# Patient Record
Sex: Female | Born: 1980 | Race: White | Hispanic: No | Smoking: Never smoker
Health system: Southern US, Community
[De-identification: ages and names within clinical notes are randomized; demographics above are authoritative.]

## PROBLEM LIST (undated history)

## (undated) DIAGNOSIS — F419 Anxiety disorder, unspecified: Secondary | ICD-10-CM

## (undated) DIAGNOSIS — F909 Attention-deficit hyperactivity disorder, unspecified type: Secondary | ICD-10-CM

## (undated) HISTORY — PX: NO PAST SURGERIES: SHX2092

## (undated) HISTORY — DX: Anxiety disorder, unspecified: F41.9

## (undated) HISTORY — DX: Attention-deficit hyperactivity disorder, unspecified type: F90.9

---

## 2000-11-19 ENCOUNTER — Inpatient Hospital Stay (HOSPITAL_COMMUNITY): Admission: AD | Admit: 2000-11-19 | Discharge: 2000-11-19 | Payer: Self-pay | Admitting: Obstetrics and Gynecology

## 2000-11-21 ENCOUNTER — Inpatient Hospital Stay (HOSPITAL_COMMUNITY): Admission: AD | Admit: 2000-11-21 | Discharge: 2000-11-23 | Payer: Self-pay | Admitting: Obstetrics & Gynecology

## 2000-12-19 ENCOUNTER — Other Ambulatory Visit: Admission: RE | Admit: 2000-12-19 | Discharge: 2000-12-19 | Payer: Self-pay | Admitting: Obstetrics & Gynecology

## 2002-06-01 ENCOUNTER — Inpatient Hospital Stay (HOSPITAL_COMMUNITY): Admission: AD | Admit: 2002-06-01 | Discharge: 2002-06-01 | Payer: Self-pay | Admitting: Obstetrics and Gynecology

## 2002-06-01 ENCOUNTER — Encounter: Payer: Self-pay | Admitting: Obstetrics and Gynecology

## 2002-06-25 ENCOUNTER — Inpatient Hospital Stay (HOSPITAL_COMMUNITY): Admission: AD | Admit: 2002-06-25 | Discharge: 2002-06-25 | Payer: Self-pay | Admitting: Obstetrics and Gynecology

## 2002-06-25 ENCOUNTER — Inpatient Hospital Stay (HOSPITAL_COMMUNITY): Admission: AD | Admit: 2002-06-25 | Discharge: 2002-06-27 | Payer: Self-pay | Admitting: Obstetrics and Gynecology

## 2002-08-09 ENCOUNTER — Other Ambulatory Visit: Admission: RE | Admit: 2002-08-09 | Discharge: 2002-08-09 | Payer: Self-pay | Admitting: Obstetrics and Gynecology

## 2003-12-02 ENCOUNTER — Other Ambulatory Visit: Admission: RE | Admit: 2003-12-02 | Discharge: 2003-12-02 | Payer: Self-pay | Admitting: Obstetrics and Gynecology

## 2006-07-23 ENCOUNTER — Inpatient Hospital Stay (HOSPITAL_COMMUNITY): Admission: AD | Admit: 2006-07-23 | Discharge: 2006-07-25 | Payer: Self-pay | Admitting: Obstetrics and Gynecology

## 2009-01-16 ENCOUNTER — Ambulatory Visit: Payer: Self-pay | Admitting: Internal Medicine

## 2009-01-16 DIAGNOSIS — I4949 Other premature depolarization: Secondary | ICD-10-CM

## 2009-01-16 DIAGNOSIS — Q898 Other specified congenital malformations: Secondary | ICD-10-CM

## 2009-01-16 DIAGNOSIS — R55 Syncope and collapse: Secondary | ICD-10-CM

## 2016-04-08 ENCOUNTER — Other Ambulatory Visit (HOSPITAL_COMMUNITY): Payer: Self-pay | Admitting: Advanced Practice Midwife

## 2016-07-06 ENCOUNTER — Encounter: Payer: Self-pay | Admitting: Emergency Medicine

## 2016-07-06 ENCOUNTER — Emergency Department
Admission: EM | Admit: 2016-07-06 | Discharge: 2016-07-06 | Disposition: A | Discharge status: Home / Self Care | Payer: BC Managed Care – PPO | Attending: Emergency Medicine | Admitting: Emergency Medicine

## 2016-07-06 ENCOUNTER — Emergency Department: Payer: BC Managed Care – PPO

## 2016-07-06 DIAGNOSIS — M79604 Pain in right leg: Secondary | ICD-10-CM | POA: Insufficient documentation

## 2016-07-06 LAB — CBC WITH DIFFERENTIAL/PLATELET
Basophils Absolute: 0 10*3/uL (ref 0–0.1)
Basophils Relative: 1 %
Eosinophils Absolute: 0.1 10*3/uL (ref 0–0.7)
Eosinophils Relative: 2 %
HEMATOCRIT: 40.8 % (ref 35.0–47.0)
Hemoglobin: 14 g/dL (ref 12.0–16.0)
Lymphocytes Relative: 24 %
Lymphs Abs: 1.6 10*3/uL (ref 1.0–3.6)
MCH: 31.4 pg (ref 26.0–34.0)
MCHC: 34.4 g/dL (ref 32.0–36.0)
MCV: 91.3 fL (ref 80.0–100.0)
MONOS PCT: 8 %
Monocytes Absolute: 0.5 10*3/uL (ref 0.2–0.9)
Neutro Abs: 4.3 10*3/uL (ref 1.4–6.5)
Neutrophils Relative %: 65 %
Platelets: 184 10*3/uL (ref 150–440)
RBC: 4.47 MIL/uL (ref 3.80–5.20)
RDW: 12.8 % (ref 11.5–14.5)
WBC: 6.6 10*3/uL (ref 3.6–11.0)

## 2016-07-06 LAB — BASIC METABOLIC PANEL
Anion gap: 5 (ref 5–15)
BUN: 13 mg/dL (ref 6–20)
CO2: 29 mmol/L (ref 22–32)
Calcium: 8.8 mg/dL — ABNORMAL LOW (ref 8.9–10.3)
Chloride: 105 mmol/L (ref 101–111)
Creatinine, Ser: 0.67 mg/dL (ref 0.44–1.00)
GFR calc Af Amer: >60 mL/min (ref >60)
GFR calc non Af Amer: >60 mL/min (ref >60)
Glucose, Bld: 88 mg/dL (ref 65–99)
Potassium: 4.1 mmol/L (ref 3.5–5.1)
Sodium: 139 mmol/L (ref 135–145)

## 2016-07-06 LAB — CK: Total CK: 41 U/L (ref 38–234)

## 2016-07-06 NOTE — ED Provider Notes (Signed)
[facility] Emergency Department Provider Note  ____________________________________________   First MD Initiated Contact with Patient 07/06/16 1809     (approximate)  I have reviewed the triage vital signs and the nursing notes.   HISTORY  Chief Complaint Leg Pain   HPI Brenda Snyder is a 36 y.o. female with a history of PVCs and long QT syndrome who is presenting with right lateral 5 pain over the past month. She says over the past week the pain is increased after 4 hour car trip. She has a Mirena IUD but does not smoke or take any exogenous estrogens. She has no history of DVT or PE and her family. She has not noted any swelling to the lower extremities, bilaterally. She says the pain is a constant ache that does not worsen when she extends her leg. She does not note any known injury or exertion. She does not exercise, run or ride a bike regularly.Dry cough noted on triage note. However, there were no colectomy about this.   History reviewed. No pertinent past medical history.  Patient Active Problem List   Diagnosis Date Noted  . PREMATURE VENTRICULAR CONTRACTIONS 01/16/2009  . LONG QT SYNDROME 01/16/2009  . SYNCOPE, VASOVAGAL 01/16/2009    History reviewed. No pertinent surgical history.  Prior to Admission medications   Not on File    Allergies Clarithromycin  History reviewed. No pertinent family history.  Social History Social History  Substance Use Topics  . Smoking status: Never Smoker  . Smokeless tobacco: Never Used  . Alcohol use Yes    Review of Systems Constitutional: No fever/chills Eyes: No visual changes. ENT: No sore throat. Cardiovascular: Denies chest pain. Respiratory: Denies shortness of breath. Gastrointestinal: No abdominal pain.  No nausea, no vomiting.  No diarrhea.  No constipation. Genitourinary: Negative for dysuria. Musculoskeletal: Negative for back pain. Skin: Negative for rash. Neurological:  Negative for headaches, focal weakness or numbness.  10-point ROS otherwise negative.  ____________________________________________   PHYSICAL EXAM:  VITAL SIGNS: ED Triage Vitals  Enc Vitals Group     BP 07/06/16 1753 118/72     Pulse Rate 07/06/16 1753 90     Resp 07/06/16 1753 18     Temp 07/06/16 1753 99.2 F (37.3 C)     Temp Source 07/06/16 1753 Oral     SpO2 07/06/16 1753 100 %     Weight 07/06/16 1754 152 lb (68.9 kg)     Height 07/06/16 1754  (1.676 m)     Head Circumference --      Peak Flow --      Pain Score 07/06/16 1753 4     Pain Loc --      Pain Edu? --      Excl. in GC? --     Constitutional: Alert and oriented. Well appearing and in no acute distress. Eyes: Conjunctivae are normal. EOMI. Head: Atraumatic. Nose: No congestion/rhinnorhea. Mouth/Throat: Mucous membranes are moist.  Neck: No stridor.   Cardiovascular: Normal rate, regular rhythm. Grossly normal heart sounds.  Good peripheral circulation. Respiratory: Normal respiratory effort.  No retractions. Lungs CTAB. Gastrointestinal: Soft and nontender. No distention.  Musculoskeletal: No lower extremity edema.  No joint effusions. Mild tenderness to palpation over the proximal third of the lateral hamstring tendons. No masses palpated. 5 out of 5 strength to bilateral lower extremities. Sensory to light touch. No swelling noted, bilaterally. Dorsalis pedis as well as posterior tibial pulses are equal and intact bilaterally. Pain  is not out of proportion to the exam. Neurologic:  Normal speech and language. No gross focal neurologic deficits are appreciated.  Skin:  Skin is warm, dry and intact. No rash noted. Psychiatric: Mood and affect are normal. Speech and behavior are normal.  ____________________________________________   LABS (all labs ordered are listed, but only abnormal results are displayed)  Labs Reviewed  CK  CBC WITH DIFFERENTIAL/PLATELET  BASIC METABOLIC PANEL    ____________________________________________  EKG  ____________________________________________  RADIOLOGY  US Venous Img Lower Unilateral Right (Final result)  Result time 07/06/16 19:10:33  Final result by Hassan Buckler, MD (07/06/16 19:10:33)           Narrative:   CLINICAL DATA: Worsening right lower extremity pain for 1 month.  EXAM: RIGHT LOWER EXTREMITY VENOUS DOPPLER ULTRASOUND  TECHNIQUE: Gray-scale sonography with graded compression, as well as color Doppler and duplex ultrasound were performed to evaluate the lower extremity deep venous systems from the level of the common femoral vein and including the common femoral, femoral, profunda femoral, popliteal and calf veins including the posterior tibial, peroneal and gastrocnemius veins when visible. The superficial great saphenous vein was also interrogated. Spectral Doppler was utilized to evaluate flow at rest and with distal augmentation maneuvers in the common femoral, femoral and popliteal veins.  COMPARISON: None.  FINDINGS: Contralateral Common Femoral Vein: Respiratory phasicity is normal and symmetric with the symptomatic side. No evidence of thrombus. Normal compressibility.  Common Femoral Vein: No evidence of thrombus. Normal compressibility, respiratory phasicity and response to augmentation.  Saphenofemoral Junction: No evidence of thrombus. Normal compressibility and flow on color Doppler imaging.  Profunda Femoral Vein: No evidence of thrombus. Normal compressibility and flow on color Doppler imaging.  Femoral Vein: No evidence of thrombus. Normal compressibility, respiratory phasicity and response to augmentation.  Popliteal Vein: No evidence of thrombus. Normal compressibility, respiratory phasicity and response to augmentation.  Calf Veins: No evidence of thrombus. Normal compressibility and flow on color Doppler imaging.  Superficial Great Saphenous Vein: No evidence  of thrombus. Normal compressibility and flow on color Doppler imaging.  Venous Reflux: None.  Other Findings: None. No mass or fluid collection demonstrated at the area of pain as indicated by the patient in the posterior upper thigh.  IMPRESSION: No evidence of deep venous thrombosis in the right lower extremity.   Electronically Signed By: Delbert Phenix M.D. On: 07/06/2016 19:10            ____________________________________________   PROCEDURES  Procedure(s) performed:   Procedures  Critical Care performed:   ____________________________________________   INITIAL IMPRESSION / ASSESSMENT AND PLAN / ED COURSE  Pertinent labs & imaging results that were available during my care of the patient were reviewed by me and considered in my medical decision making (see chart for details).  ----------------------------------------- 8:26 PM on 07/06/2016 -----------------------------------------  Patient without any distress at this time. Resting comfortable. Very reassuring workup without evidence of DVT. Neurologic blood cell count. Pain is not out of portion to exam. Very unlikely to be severe soft tissue infection like necrotizing fasciitis. Recommended anti-inflammatory medications. Possible tendinitis. Will follow-up with her primary care doctor. Will be discharged home. Discussed lab results as well as imaging is plan with the family as well as the patient and they're understandable and comply.      ____________________________________________   FINAL CLINICAL IMPRESSION(S) / ED DIAGNOSES  Extremity pain.   NEW MEDICATIONS STARTED DURING THIS VISIT:  New Prescriptions   No medications on file  Note:  This document was prepared using Dragon voice recognition software and may include unintentional dictation errors.    Myrna Blazer, MD 07/06/16 2030

## 2016-07-06 NOTE — ED Triage Notes (Signed)
Pt reports right upper posterior leg pain x 1 month ,states the pain sometimes is better with walking, reports over the past week pain worse. Pt states on Wednesday the pain was the worst when she was traveling in the car for 4 hours round trip.  States pain to palpation. No meds for pain.  Pain worse at diferent times.   Tried using heating pad for pain relief. Describes pain as constant throbbing .  No decreased sensation at this time.  Pt also states she has had a dry cough x 4 weeks.

## 2016-09-16 ENCOUNTER — Other Ambulatory Visit: Payer: Self-pay | Admitting: Advanced Practice Midwife

## 2017-05-18 ENCOUNTER — Other Ambulatory Visit: Payer: Self-pay

## 2017-05-18 ENCOUNTER — Emergency Department
Admission: EM | Admit: 2017-05-18 | Discharge: 2017-05-18 | Disposition: A | Discharge status: Home / Self Care | Payer: BC Managed Care – PPO | Attending: Emergency Medicine | Admitting: Emergency Medicine

## 2017-05-18 ENCOUNTER — Encounter: Payer: Self-pay | Admitting: Emergency Medicine

## 2017-05-18 DIAGNOSIS — R51 Headache: Secondary | ICD-10-CM | POA: Insufficient documentation

## 2017-05-18 DIAGNOSIS — Z5321 Procedure and treatment not carried out due to patient leaving prior to being seen by health care provider: Secondary | ICD-10-CM | POA: Insufficient documentation

## 2017-05-18 NOTE — ED Triage Notes (Signed)
Pt reports that she thought that she had a sinus infection, but her head is hurting behind her right eye. She took some Ibuprofen and it helps for a little bit then comes back. She reports that she has had this for a week now.

## 2017-05-18 NOTE — ED Notes (Signed)
FN: [facility] reports of the worst headache of her  Life for two weeks.

## 2018-02-02 ENCOUNTER — Ambulatory Visit (INDEPENDENT_AMBULATORY_CARE_PROVIDER_SITE_OTHER): Payer: BC Managed Care – PPO | Admitting: Certified Nurse Midwife

## 2018-02-02 ENCOUNTER — Encounter: Payer: Self-pay | Admitting: Certified Nurse Midwife

## 2018-02-02 VITALS — BP 106/78 | HR 107 | Ht 66.0 in | Wt 136.6 lb

## 2018-02-02 DIAGNOSIS — Z30433 Encounter for removal and reinsertion of intrauterine contraceptive device: Secondary | ICD-10-CM | POA: Diagnosis not present

## 2018-02-02 NOTE — Patient Instructions (Signed)

## 2018-02-02 NOTE — Progress Notes (Signed)
  GYNECOLOGY OFFICE PROCEDURE NOTE  Brenda Snyder is a 37 y.o. No obstetric history on file. here for Mirena IUD removal and reinsertion. No GYN concerns.   IUD Removal and Reinsertion  Patient identified, informed consent performed, consent signed.   Discussed risks of irregular bleeding, cramping, infection, malpositioning or misplacement of the IUD outside the uterus which may require further procedures. Also advised to use backup contraception for one week as the risk of pregnancy is higher during the transition period of removing an IUD and replacing it with another one. Time out was performed. Speculum placed in the vagina. The strings of the IUD were not seen. IUD finder was used to retreive the strings from inside the cervix. Once the strings were visualized they were  grasped and pulled using ring forceps. The IUD was successfully removed in its entirety. The cervix was cleaned with Betadine x 2 and grasped anteriorly with a single tooth tenaculum.  The new Mirena IUD insertion apparatus was used to sound the uterus to 7 cm;  The uterus was anteverted. The Tenaculum was removed from anterior cervix (10 &2 o'clock)  to posterior cervix ( 5 & 8 o'clock) the IUD was then placed per manufacturer's recommendations. Strings trimmed to 3 cm. Tenaculum was removed, good hemostasis noted. Patient tolerated procedure well.   Patient was given post-procedure instructions.  She was reminded to have backup contraception for one week during this transition period between IUDs.  Patient was also asked to check IUD strings periodically and follow up in 4 weeks for IUD check.   Doreene Burke, CNM

## 2018-03-10 ENCOUNTER — Encounter: Payer: BC Managed Care – PPO | Admitting: Certified Nurse Midwife

## 2018-06-15 ENCOUNTER — Telehealth: Payer: Self-pay | Admitting: *Deleted

## 2018-06-15 NOTE — Telephone Encounter (Signed)
-----   Message from Aviva Signs, CNM sent at 06/14/2018  2:28 PM EDT ----- Regarding: Probable miscarriage Newly pregnant. Maybe 5 weeks was excited but started bleeding and cramping this weekend.    I kept her out of mau but needs a quant and maybe [place] to rule out retained products   She will call  Hilda Lias

## 2018-12-07 ENCOUNTER — Other Ambulatory Visit: Payer: Self-pay

## 2018-12-07 DIAGNOSIS — Z20822 Contact with and (suspected) exposure to covid-19: Secondary | ICD-10-CM

## 2018-12-08 LAB — NOVEL CORONAVIRUS, NAA: SARS-CoV-2, NAA: NOT DETECTED

## 2019-04-05 ENCOUNTER — Encounter: Payer: BC Managed Care – PPO | Admitting: Certified Nurse Midwife

## 2019-05-18 ENCOUNTER — Ambulatory Visit (INDEPENDENT_AMBULATORY_CARE_PROVIDER_SITE_OTHER): Payer: BC Managed Care – PPO | Admitting: Certified Nurse Midwife

## 2019-05-18 ENCOUNTER — Other Ambulatory Visit: Payer: Self-pay

## 2019-05-18 ENCOUNTER — Other Ambulatory Visit (HOSPITAL_COMMUNITY)
Admission: RE | Admit: 2019-05-18 | Discharge: 2019-05-18 | Disposition: A | Discharge status: Home / Self Care | Payer: BC Managed Care – PPO | Source: Ambulatory Visit | Attending: Certified Nurse Midwife | Admitting: Certified Nurse Midwife

## 2019-05-18 ENCOUNTER — Encounter: Payer: Self-pay | Admitting: Certified Nurse Midwife

## 2019-05-18 VITALS — BP 104/79 | HR 92 | Ht 66.0 in | Wt 153.9 lb

## 2019-05-18 DIAGNOSIS — Z01419 Encounter for gynecological examination (general) (routine) without abnormal findings: Secondary | ICD-10-CM

## 2019-05-18 DIAGNOSIS — Z113 Encounter for screening for infections with a predominantly sexual mode of transmission: Secondary | ICD-10-CM

## 2019-05-18 DIAGNOSIS — Z124 Encounter for screening for malignant neoplasm of cervix: Secondary | ICD-10-CM | POA: Insufficient documentation

## 2019-05-18 DIAGNOSIS — Z1231 Encounter for screening mammogram for malignant neoplasm of breast: Secondary | ICD-10-CM

## 2019-05-18 DIAGNOSIS — Z23 Encounter for immunization: Secondary | ICD-10-CM

## 2019-05-18 NOTE — Addendum Note (Signed)
Addended by: Marchelle Folks on: 05/18/2019 04:23 PM   Modules accepted: Orders

## 2019-05-18 NOTE — Patient Instructions (Signed)

## 2019-05-18 NOTE — Progress Notes (Signed)
GYNECOLOGY ANNUAL PREVENTATIVE CARE ENCOUNTER NOTE  History:     Brenda Snyder is a 39 y.o. [phone] female here for a routine annual gynecologic exam.  Current complaints: none   Denies abnormal vaginal bleeding, discharge, pelvic pain, problems with intercourse or other gynecologic concerns.    Social Heterosexual (divorced) , in a relationship with female partner Lives with her 3 children Works: Pharmacist, hospital Exercise;NO Denies drugs and smoking, occasional wine.    Gynecologic History Patient's last menstrual period was 03/26/2019. Contraception: IUD Last Pap: unknown. Last mammogram: n/a .   Obstetric History OB History  Gravida Para Term Preterm AB Living  3 3 3     3   SAB TAB Ectopic Multiple Live Births          3    # Outcome Date GA Lbr Len/2nd Weight Sex Delivery Anes PTL Lv  3 Term 2008    F Vag-Spont   LIV  2 Term 2004    M Vag-Spont   LIV  1 Term 2002    F Vag-Spont   LIV    Past Medical History:  Diagnosis Date  . ADHD   . Anxiety     Past Surgical History:  Procedure Laterality Date  . NO PAST SURGERIES      Current Outpatient Medications on File Prior to Visit  Medication Sig Dispense Refill  . bimatoprost (LATISSE) 0.03 % ophthalmic solution Place one drop on applicator and apply evenly at the base of eyelashes nightly. Repeat procedure for the other eye.    . levonorgestrel (MIRENA) 20 MCG/24HR IUD 1 each by Intrauterine route once.    . propranolol (INDERAL) 10 MG tablet Take by mouth.    . tretinoin (RETIN-A) 0.1 % cream tretinoin 0.1 % topical cream    . VYVANSE 70 MG capsule Take 70 mg by mouth every morning.  0   No current facility-administered medications on file prior to visit.    Allergies  Allergen Reactions  . Biaxin [Clarithromycin]     Prolong QT    Social History:  reports that she has never smoked. She has never used smokeless tobacco. She reports current alcohol use. She reports previous drug use.  Family History   Problem Relation Age of Onset  . Diabetes Father   . Cancer Maternal Grandmother   . Colon cancer Maternal Grandmother   . Cancer Paternal Grandmother   . Colon cancer Paternal Grandmother   . Diabetes Paternal Grandfather   . Breast cancer Neg Hx   . Ovarian cancer Neg Hx     The following portions of the patient's history were reviewed and updated as appropriate: allergies, current medications, past family history, past medical history, past social history, past surgical history and problem list.  Review of Systems Pertinent items noted in HPI and remainder of comprehensive ROS otherwise negative.  Physical Exam:  BP 104/79   Pulse 92   Ht 5\' 6"  (1.676 m)   Wt 153 lb 14.4 oz (69.8 kg)   LMP 03/26/2019   BMI 24.84 kg/m  CONSTITUTIONAL: Well-developed, well-nourished female in no acute distress.  HENT:  Normocephalic, atraumatic, External right and left ear normal. Oropharynx is clear and moist EYES: Conjunctivae and EOM are normal. Pupils are equal, round, and reactive to light. No scleral icterus.  NECK: Normal range of motion, supple, no masses.  Normal thyroid.  SKIN: Skin is warm and dry. No rash noted. Not diaphoretic. No erythema. No pallor. MUSCULOSKELETAL: Normal range of motion. No  tenderness.  No cyanosis, clubbing, or edema.  2+ distal pulses. NEUROLOGIC: Alert and oriented to person, place, and time. Normal reflexes, muscle tone coordination.  PSYCHIATRIC: Normal mood and affect. Normal behavior. Normal judgment and thought content. CARDIOVASCULAR: Normal heart rate noted, regular rhythm RESPIRATORY: Clear to auscultation bilaterally. Effort and breath sounds normal, no problems with respiration noted. BREASTS: Symmetric in size. No masses, tenderness, skin changes, nipple drainage, or lymphadenopathy bilaterally. ABDOMEN: Soft, no distention noted.  No tenderness, rebound or guarding.  PELVIC: Normal appearing external genitalia and urethral meatus; normal  appearing vaginal mucosa and cervix.  No abnormal discharge noted.  Pap smear obtained.contact bleeding  Normal uterine size, no other palpable masses, no uterine or adnexal tenderness.   Assessment and Plan:    1. Screening for cervical cancer - Cytology - PAP  2. Encounter for screening mammogram for malignant neoplasm of breast - MM 3D SCREEN BREAST BILATERAL; Future  3. Screening for STD (sexually transmitted disease) - HIV Antibody (routine testing w rflx)  4. Women's annual routine gynecological examination   Will follow up results of pap smear and manage accordingly. Mammogram scheduled Orders: labs HIV- health maintenance, Tdap- pt state she had done 20yrs ago, will check with her PCP.  Flu shot given  Routine preventative health maintenance measures emphasized. Please refer to After Visit Summary for other counseling recommendations.      Doreene Burke, CNM

## 2019-05-19 LAB — HIV ANTIBODY (ROUTINE TESTING W REFLEX): HIV Screen 4th Generation wRfx: NONREACTIVE

## 2019-05-25 ENCOUNTER — Other Ambulatory Visit: Payer: Self-pay | Admitting: Certified Nurse Midwife

## 2019-05-25 LAB — CYTOLOGY - PAP
Comment: NEGATIVE
Diagnosis: NEGATIVE
High risk HPV: NEGATIVE

## 2019-05-25 MED ORDER — FLUCONAZOLE 150 MG PO TABS: 150.0000 mg | ORAL_TABLET | Freq: Once | ORAL | 0 refills | Status: AC

## 2019-05-25 NOTE — Progress Notes (Signed)
Pap smear negative. Show yeast present. Order placed for treatment. Pt notified via my chart.   Doreene Burke, CNM

## 2019-08-18 ENCOUNTER — Ambulatory Visit: Payer: BC Managed Care – PPO | Attending: Internal Medicine

## 2020-03-01 ENCOUNTER — Other Ambulatory Visit: Payer: Self-pay | Admitting: Internal Medicine

## 2020-03-01 DIAGNOSIS — Z1231 Encounter for screening mammogram for malignant neoplasm of breast: Secondary | ICD-10-CM

## 2020-03-16 ENCOUNTER — Ambulatory Visit
Admission: RE | Admit: 2020-03-16 | Discharge: 2020-03-16 | Disposition: A | Discharge status: Home / Self Care | Payer: BC Managed Care – PPO | Source: Ambulatory Visit | Attending: Internal Medicine | Admitting: Internal Medicine

## 2020-03-16 ENCOUNTER — Other Ambulatory Visit: Payer: Self-pay

## 2020-03-16 DIAGNOSIS — Z1231 Encounter for screening mammogram for malignant neoplasm of breast: Secondary | ICD-10-CM | POA: Insufficient documentation

## 2021-05-07 ENCOUNTER — Other Ambulatory Visit: Payer: Self-pay | Admitting: Internal Medicine

## 2021-05-07 DIAGNOSIS — Z1231 Encounter for screening mammogram for malignant neoplasm of breast: Secondary | ICD-10-CM

## 2021-05-11 ENCOUNTER — Ambulatory Visit
Admission: RE | Admit: 2021-05-11 | Discharge: 2021-05-11 | Disposition: A | Discharge status: Home / Self Care | Payer: BC Managed Care – PPO | Source: Ambulatory Visit | Attending: Internal Medicine | Admitting: Internal Medicine

## 2021-05-11 ENCOUNTER — Other Ambulatory Visit: Payer: Self-pay

## 2021-05-11 DIAGNOSIS — Z1231 Encounter for screening mammogram for malignant neoplasm of breast: Secondary | ICD-10-CM | POA: Diagnosis not present

## 2021-05-16 ENCOUNTER — Other Ambulatory Visit: Payer: Self-pay | Admitting: Internal Medicine

## 2021-05-16 DIAGNOSIS — N6489 Other specified disorders of breast: Secondary | ICD-10-CM

## 2021-05-16 DIAGNOSIS — R928 Other abnormal and inconclusive findings on diagnostic imaging of breast: Secondary | ICD-10-CM

## 2021-05-16 DIAGNOSIS — N63 Unspecified lump in unspecified breast: Secondary | ICD-10-CM

## 2021-05-30 ENCOUNTER — Ambulatory Visit
Admission: RE | Admit: 2021-05-30 | Discharge: 2021-05-30 | Disposition: A | Discharge status: Home / Self Care | Payer: BC Managed Care – PPO | Source: Ambulatory Visit | Attending: Internal Medicine | Admitting: Internal Medicine

## 2021-05-30 ENCOUNTER — Other Ambulatory Visit: Payer: Self-pay

## 2021-05-30 DIAGNOSIS — N6489 Other specified disorders of breast: Secondary | ICD-10-CM

## 2021-05-30 DIAGNOSIS — N63 Unspecified lump in unspecified breast: Secondary | ICD-10-CM | POA: Insufficient documentation

## 2021-05-30 DIAGNOSIS — R928 Other abnormal and inconclusive findings on diagnostic imaging of breast: Secondary | ICD-10-CM

## 2021-07-24 ENCOUNTER — Encounter: Payer: Self-pay | Admitting: Certified Nurse Midwife

## 2022-05-15 ENCOUNTER — Other Ambulatory Visit: Payer: Self-pay | Admitting: Internal Medicine

## 2022-05-15 DIAGNOSIS — N63 Unspecified lump in unspecified breast: Secondary | ICD-10-CM

## 2022-06-03 ENCOUNTER — Other Ambulatory Visit: Payer: BC Managed Care – PPO

## 2022-06-03 ENCOUNTER — Ambulatory Visit
Admission: RE | Admit: 2022-06-03 | Discharge: 2022-06-03 | Disposition: A | Discharge status: Home / Self Care | Payer: BC Managed Care – PPO | Source: Ambulatory Visit | Attending: Internal Medicine | Admitting: Internal Medicine

## 2022-06-03 DIAGNOSIS — N63 Unspecified lump in unspecified breast: Secondary | ICD-10-CM

## 2022-07-03 ENCOUNTER — Ambulatory Visit (INDEPENDENT_AMBULATORY_CARE_PROVIDER_SITE_OTHER): Payer: BC Managed Care – PPO | Admitting: Certified Nurse Midwife

## 2022-07-03 ENCOUNTER — Other Ambulatory Visit (HOSPITAL_COMMUNITY)
Admission: RE | Admit: 2022-07-03 | Discharge: 2022-07-03 | Disposition: A | Discharge status: Home / Self Care | Payer: BC Managed Care – PPO | Source: Ambulatory Visit | Attending: Certified Nurse Midwife | Admitting: Certified Nurse Midwife

## 2022-07-03 ENCOUNTER — Encounter: Payer: Self-pay | Admitting: Certified Nurse Midwife

## 2022-07-03 VITALS — BP 109/77 | HR 89 | Ht 66.0 in | Wt 161.9 lb

## 2022-07-03 DIAGNOSIS — Z124 Encounter for screening for malignant neoplasm of cervix: Secondary | ICD-10-CM | POA: Insufficient documentation

## 2022-07-03 DIAGNOSIS — Z1231 Encounter for screening mammogram for malignant neoplasm of breast: Secondary | ICD-10-CM

## 2022-07-03 DIAGNOSIS — Z113 Encounter for screening for infections with a predominantly sexual mode of transmission: Secondary | ICD-10-CM | POA: Insufficient documentation

## 2022-07-03 DIAGNOSIS — Z01419 Encounter for gynecological examination (general) (routine) without abnormal findings: Secondary | ICD-10-CM

## 2022-07-03 NOTE — Progress Notes (Signed)
GYNECOLOGY ANNUAL PREVENTATIVE CARE ENCOUNTER NOTE  History:     Brenda Snyder is a 42 y.o. [phone] female here for a routine annual gynecologic exam.  Current complaints: recently found out her previous partner was cheating on her. Requesting STD screening.   Denies abnormal vaginal bleeding, discharge, pelvic pain, problems with intercourse or other gynecologic concerns.     Social Relationship: single  Living:with children Work: Runner, broadcasting/film/video  Exercise: walking  Smoke/Alcohol/drug use: occ wine.   Gynecologic History No LMP recorded (lmp unknown). (Menstrual status: IUD). Contraception: IUD Last Pap: 05/18/19. Results were: normal Last mammogram: 06/01/2019. Results were: normal  Obstetric History OB History  Gravida Para Term Preterm AB Living  3 3 3     3   SAB IAB Ectopic Multiple Live Births          3    # Outcome Date GA Lbr Len/2nd Weight Sex Delivery Anes PTL Lv  3 Term 2008    F Vag-Spont   LIV  2 Term 2004    M Vag-Spont   LIV  1 Term 2002    F Vag-Spont   LIV    Past Medical History:  Diagnosis Date   ADHD    Anxiety     Past Surgical History:  Procedure Laterality Date   NO PAST SURGERIES      Current Outpatient Medications on File Prior to Visit  Medication Sig Dispense Refill   amphetamine-dextroamphetamine (ADDERALL XR) 30 MG 24 hr capsule Take 30 mg by mouth daily.     bimatoprost (LATISSE) 0.03 % ophthalmic solution PLACE 1 DROP ON APPLICATOR AND APPLY EVENLY AT BASE OF EYELASHES NIGHTLY     LAGEVRIO 200 MG CAPS capsule Take by mouth.     levonorgestrel (MIRENA) 20 MCG/24HR IUD 1 each by Intrauterine route once.     phentermine (ADIPEX-P) 37.5 MG tablet Take 37.5 mg by mouth daily.     propranolol (INDERAL) 10 MG tablet Take by mouth.     VYVANSE 60 MG capsule Take 60 mg by mouth every morning. (Patient not taking: Reported on 07/03/2022)     VYVANSE 70 MG capsule Take 70 mg by mouth every morning. (Patient not taking: Reported on  07/03/2022)  0   No current facility-administered medications on file prior to visit.    Allergies  Allergen Reactions   Biaxin [Clarithromycin]     Prolong QT    Social History:  reports that she has never smoked. She has never used smokeless tobacco. She reports current alcohol use. She reports that she does not currently use drugs.  Family History  Problem Relation Age of Onset   Diabetes Father    Cancer Maternal Grandmother    Colon cancer Maternal Grandmother    Cancer Paternal Grandmother    Colon cancer Paternal Grandmother    Diabetes Paternal Grandfather    Breast cancer Neg Hx    Ovarian cancer Neg Hx     The following portions of the patient's history were reviewed and updated as appropriate: allergies, current medications, past family history, past medical history, past social history, past surgical history and problem list.  Review of Systems Pertinent items noted in HPI and remainder of comprehensive ROS otherwise negative.  Physical Exam:  BP 109/77   Pulse 89   Ht 5\' 6"  (1.676 m)   Wt 161 lb 14.4 oz (73.4 kg)   LMP  (LMP Unknown)   BMI 26.13 kg/m  CONSTITUTIONAL: Well-developed, well-nourished  female in no acute distress.  HENT:  Normocephalic, atraumatic, External right and left ear normal. Oropharynx is clear and moist EYES: Conjunctivae and EOM are normal. Pupils are equal, round, and reactive to light. No scleral icterus.  NECK: Normal range of motion, supple, no masses.  Normal thyroid.  SKIN: Skin is warm and dry. No rash noted. Not diaphoretic. No erythema. No pallor. MUSCULOSKELETAL: Normal range of motion. No tenderness.  No cyanosis, clubbing, or edema.  2+ distal pulses. NEUROLOGIC: Alert and oriented to person, place, and time. Normal reflexes, muscle tone coordination.  PSYCHIATRIC: Normal mood and affect. Normal behavior. Normal judgment and thought content. CARDIOVASCULAR: Normal heart rate noted, regular rhythm RESPIRATORY: Clear to  auscultation bilaterally. Effort and breath sounds normal, no problems with respiration noted. BREASTS: Symmetric in size. No masses, tenderness, skin changes, nipple drainage, or lymphadenopathy bilaterally.  ABDOMEN: Soft, no distention noted.  No tenderness, rebound or guarding.  PELVIC: Normal appearing external genitalia and urethral meatus; normal appearing vaginal mucosa and cervix.  No abnormal discharge noted.  Pap smear obtained.  IUD strings present. Normal uterine size, no other palpable masses, no uterine or adnexal tenderness.  .   Assessment and Plan:    1. Women's annual routine gynecological examination    Pap: Will follow up results of pap smear and manage accordingly. Mammogram : ordered Labs: STD testing, Swab Refills: none Referral: none  Routine preventative health maintenance measures emphasized. Please refer to After Visit Summary for other counseling recommendations.      Doreene Burke, CNM [facility]  [facility],  [facility]

## 2022-07-04 LAB — CERVICOVAGINAL ANCILLARY ONLY
Bacterial Vaginitis (gardnerella): POSITIVE — AB
Candida Glabrata: NEGATIVE
Candida Vaginitis: NEGATIVE
Chlamydia: NEGATIVE
Comment: NEGATIVE
Comment: NEGATIVE
Comment: NEGATIVE
Comment: NEGATIVE
Comment: NEGATIVE
Comment: NORMAL
Neisseria Gonorrhea: NEGATIVE
Trichomonas: NEGATIVE

## 2022-07-04 LAB — HEPATITIS B SURFACE ANTIGEN: Hepatitis B Surface Ag: NEGATIVE

## 2022-07-04 LAB — RPR: RPR Ser Ql: NONREACTIVE

## 2022-07-04 LAB — HIV ANTIBODY (ROUTINE TESTING W REFLEX): HIV Screen 4th Generation wRfx: NONREACTIVE

## 2022-07-04 LAB — HEPATITIS C ANTIBODY: Hep C Virus Ab: NONREACTIVE

## 2022-07-12 LAB — CYTOLOGY - PAP
Comment: NEGATIVE
Comment: NEGATIVE
Comment: NEGATIVE
HPV 16: NEGATIVE
HPV 18 / 45: NEGATIVE
High risk HPV: POSITIVE — AB

## 2022-07-15 ENCOUNTER — Encounter: Payer: Self-pay | Admitting: Certified Nurse Midwife

## 2022-07-16 ENCOUNTER — Encounter: Payer: Self-pay | Admitting: Certified Nurse Midwife

## 2022-09-05 ENCOUNTER — Encounter: Payer: BC Managed Care – PPO | Admitting: Obstetrics and Gynecology

## 2022-09-05 ENCOUNTER — Telehealth: Payer: Self-pay | Admitting: Obstetrics and Gynecology

## 2022-09-05 DIAGNOSIS — B977 Papillomavirus as the cause of diseases classified elsewhere: Secondary | ICD-10-CM

## 2022-09-05 DIAGNOSIS — R87612 Low grade squamous intraepithelial lesion on cytologic smear of cervix (LGSIL): Secondary | ICD-10-CM

## 2022-09-05 NOTE — Telephone Encounter (Signed)
Reached out to pt to reschedule colpo appt that was scheduled with Dr. Logan Bores on 09/05/2022 at 2:15.  Left message for pt to call back.

## 2022-09-06 ENCOUNTER — Encounter: Payer: Self-pay | Admitting: Obstetrics and Gynecology

## 2022-09-06 NOTE — Telephone Encounter (Signed)
Reached out to pt (2x) to reschedule colpo appt that was scheduled with Dr.Evans on 09/05/2022 at 2:15.  Left message for pt to call back.  Will send a MyChart letter.

## 2022-11-18 IMAGING — MG DIGITAL DIAGNOSTIC BILAT W/ TOMO W/ CAD
6 of 10 series · 6 of 30 positions shown · non-contrast
Comparison: Previous exam(s).

CLINICAL DATA: Patient was recalled from screening mammogram for a
possible mass in the right breast and a possible asymmetry in the
left breast.

EXAM:
DIGITAL DIAGNOSTIC BILATERAL MAMMOGRAM WITH TOMOSYNTHESIS AND CAD;
ULTRASOUND RIGHT BREAST LIMITED
TECHNIQUE: Bilateral digital diagnostic mammography and breast tomosynthesis
was performed. The images were evaluated with computer-aided
detection.; Targeted ultrasound examination of the right breast was
performed

[R MLO synth-2D]
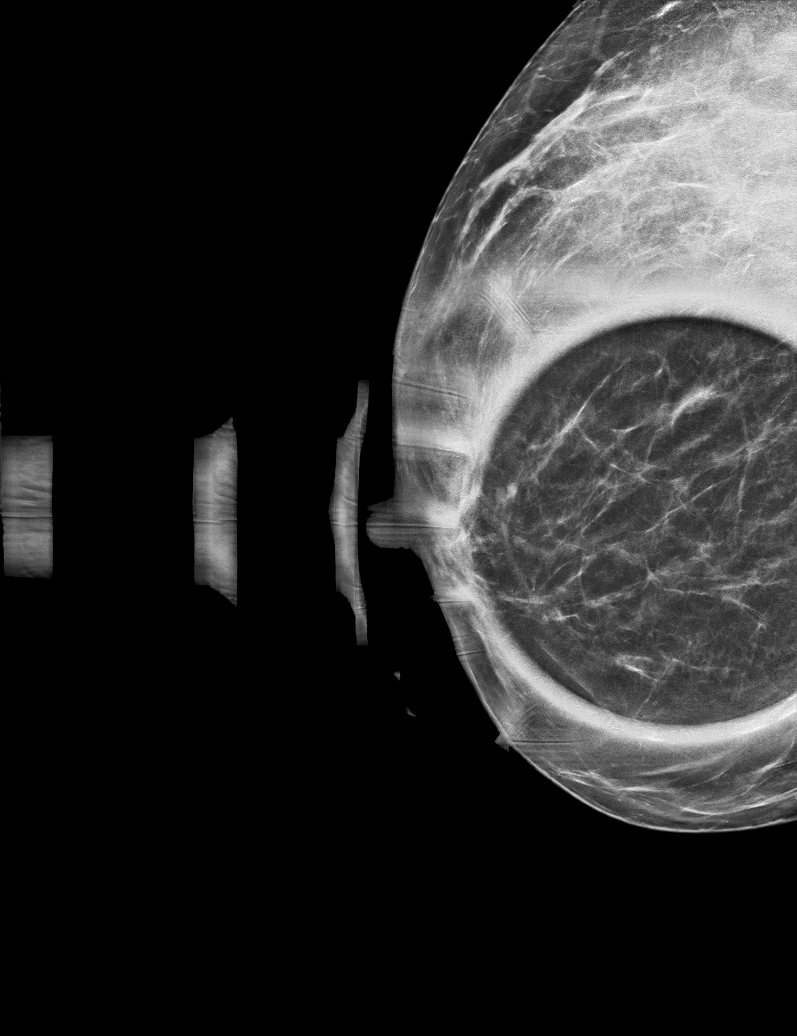

[L ML synth-2D]
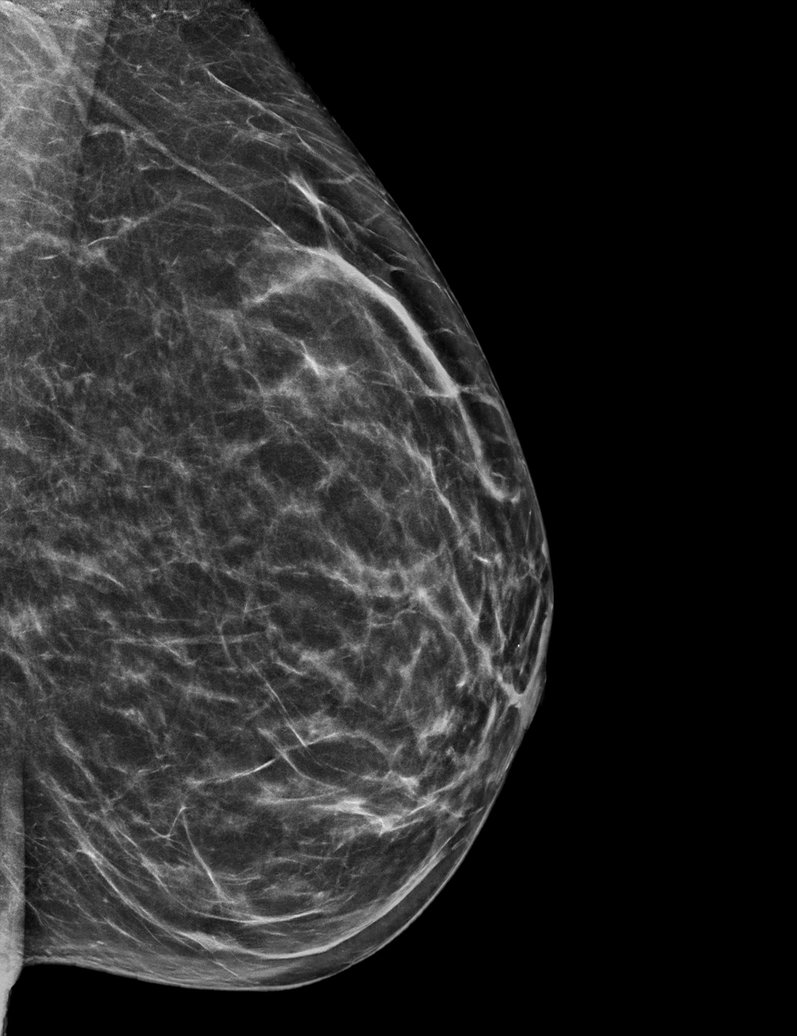

[L CC synth-2D]
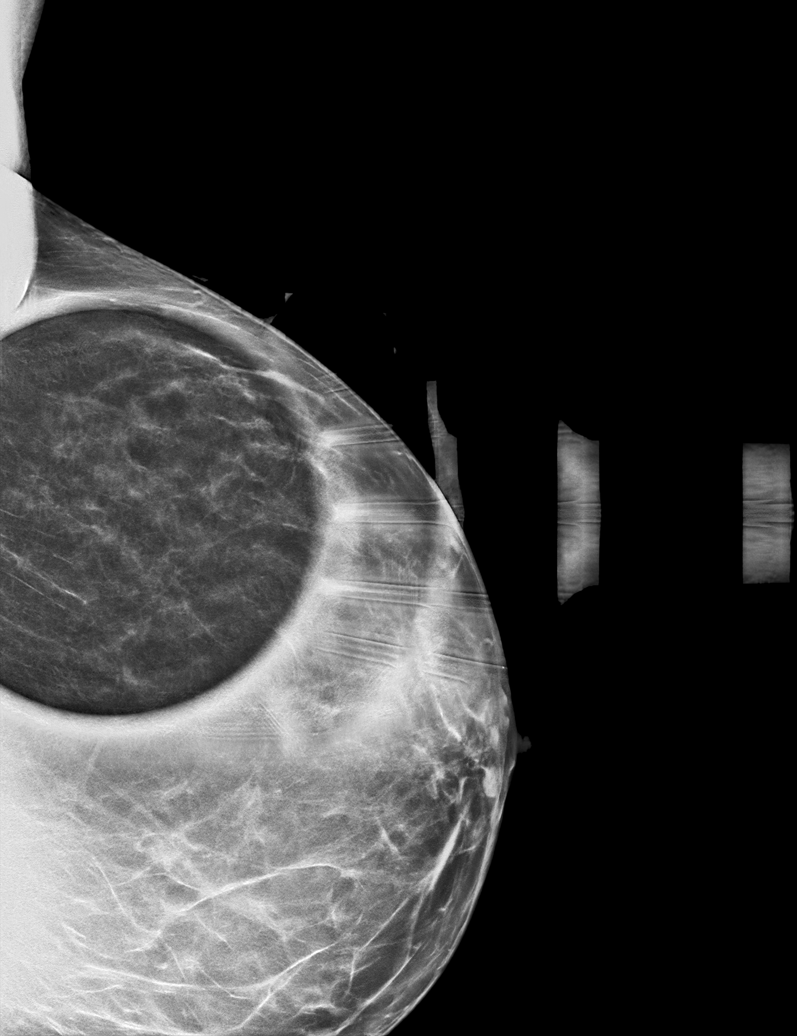

[R ML synth-2D]
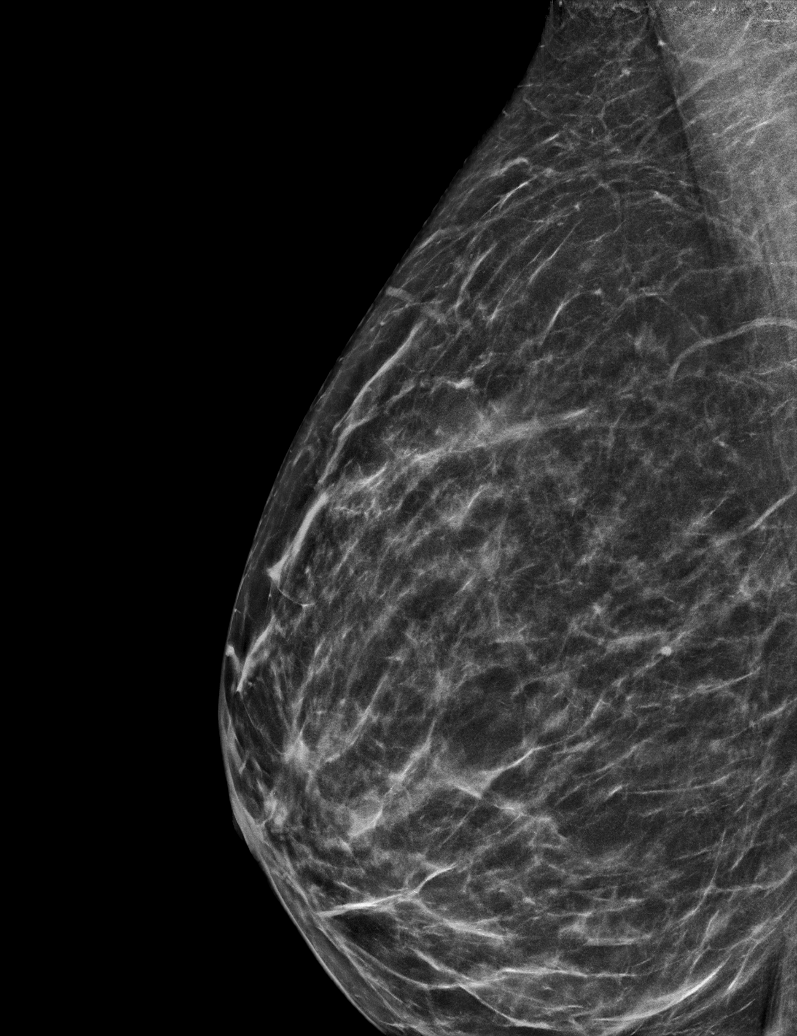

[R CC synth-2D]
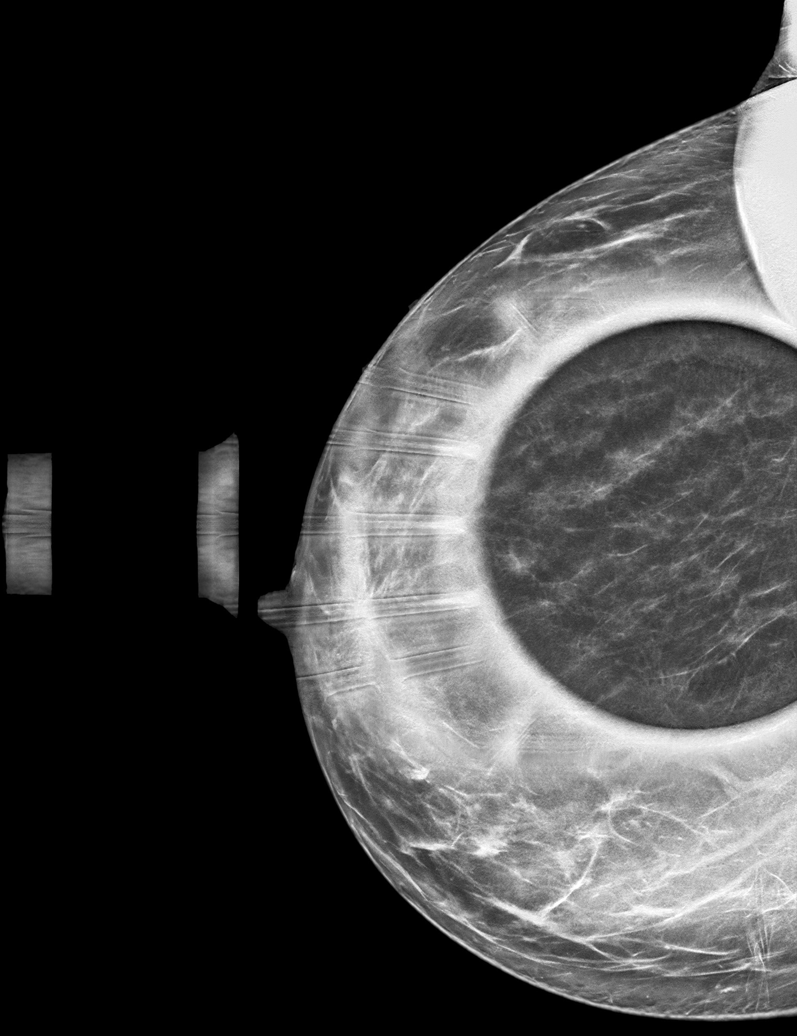

[L CC tomo · tomo slice 31/61.0]
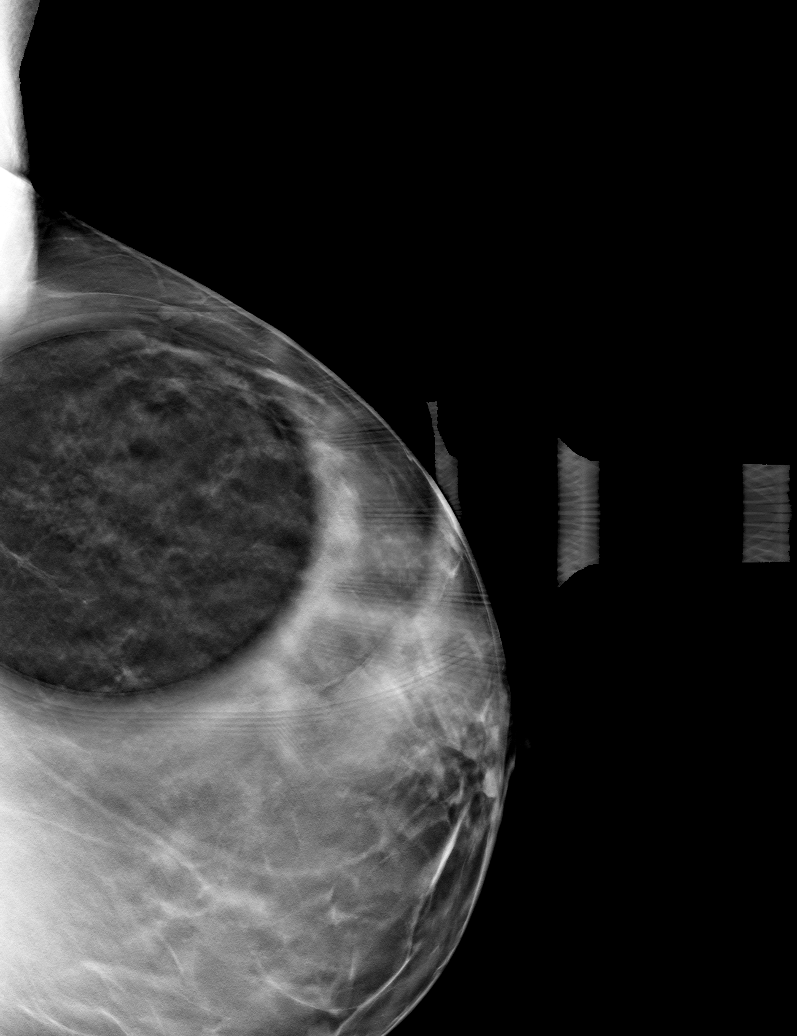

[6 of 30 positions shown; findings below may reference images not displayed]

ACR Breast Density Category c: The breast tissue is heterogeneously
dense, which may obscure small masses.
FINDINGS: Additional imaging of the left breast was performed. No persistent
mass, distortion or malignant type microcalcifications identified.

Additional imaging of the right breast was performed showing
persistence of a 7 mm ovoid mass in the lateral aspect of the
breast.

On physical exam, I do not palpate a mass in the lateral aspect of
the right breast.

Targeted ultrasound is performed, showing probable benign cluster of
cysts (apocrine metaplasia) in the right breast at 10 o'clock 5 cm
from the nipple measuring 6 x 2 x 5 mm.
IMPRESSION: Probable benign apocrine metaplasia in the 10 o'clock region of the
right breast.

RECOMMENDATION:
Short-term interval follow-up right breast ultrasound in 6 months is
recommended.

I have discussed the findings and recommendations with the patient.
If applicable, a reminder letter will be sent to the patient
regarding the next appointment.

BI-RADS CATEGORY  3: Probably benign.

## 2022-12-17 ENCOUNTER — Ambulatory Visit: Payer: BC Managed Care – PPO | Admitting: Obstetrics & Gynecology

## 2022-12-17 ENCOUNTER — Other Ambulatory Visit (HOSPITAL_COMMUNITY)
Admission: RE | Admit: 2022-12-17 | Discharge: 2022-12-17 | Disposition: A | Discharge status: Home / Self Care | Payer: BC Managed Care – PPO | Source: Ambulatory Visit | Attending: Obstetrics & Gynecology | Admitting: Obstetrics & Gynecology

## 2022-12-17 ENCOUNTER — Encounter: Payer: Self-pay | Admitting: Obstetrics & Gynecology

## 2022-12-17 VITALS — BP 114/72 | HR 109 | Ht 66.0 in | Wt 162.0 lb

## 2022-12-17 DIAGNOSIS — B977 Papillomavirus as the cause of diseases classified elsewhere: Secondary | ICD-10-CM

## 2022-12-17 DIAGNOSIS — R87612 Low grade squamous intraepithelial lesion on cytologic smear of cervix (LGSIL): Secondary | ICD-10-CM | POA: Diagnosis present

## 2022-12-17 DIAGNOSIS — Z23 Encounter for immunization: Secondary | ICD-10-CM

## 2022-12-17 DIAGNOSIS — N870 Mild cervical dysplasia: Secondary | ICD-10-CM | POA: Diagnosis not present

## 2022-12-17 NOTE — Addendum Note (Signed)
Addended by: Cornelius Moras D on: 12/17/2022 02:02 PM   Modules accepted: Orders

## 2022-12-17 NOTE — Progress Notes (Signed)
GYNECOLOGY PROGRESS NOTE  Subjective:    Patient ID: Brenda Snyder, female    DOB: Sep 29, 1980, 42 y.o.   MRN: [id number]  HPI  Patient is a 42 y.o. divorced G3P3003 here for a colpo due to a LGSIL, + HR HPV pap. She has not had Gardasil yet, restarted smoking recently.  The following portions of the patient's history were reviewed and updated as appropriate: allergies, current medications, past family history, past medical history, past social history, past surgical history, and problem list.  Review of Systems Pertinent items are noted in HPI.   Objective:   Blood pressure 114/72, pulse (!) 109, height 5\' 6"  (1.676 m), weight 162 lb (73.5 kg). Body mass index is 26.15 kg/m. Well nourished, well hydrated White female, no apparent distress She is ambulating and conversing normally. UPT negative, consent signed, time out done Cervix prepped with acetic acid. Transformation zone seen in its entirety. Colpo adequate. 0.6 x 0.6 mm area of punctation at the 3 o'clock position on ectocervix. The remainder appears normal. I obtained a biopsy at the 3 o'clock position and used silver nitrate to achieve hemostasis. ECC obtained. She tolerated the procedure well.   Assessment:   1. LGSIL on Pap smear of cervix   2. High risk HPV infection      Plan:   1. LGSIL on Pap smear of cervix - await pathology I discussed LEEP and will message her Monday with the management plan - Surgical pathology  2. High risk HPV infection  - Surgical pathology   - She will start Gardasil today - We discussed that tobacco increases the risk of cervical cancer.  - She plans to get her flu vaccine at her work.

## 2022-12-19 LAB — SURGICAL PATHOLOGY

## 2022-12-24 ENCOUNTER — Encounter: Payer: Self-pay | Admitting: Obstetrics & Gynecology

## 2023-02-14 NOTE — Progress Notes (Unsigned)
    NURSE VISIT NOTE  Subjective:    Patient ID: Brenda Snyder, female    DOB: June 25, 1980, 42 y.o.   MRN: [id number]  HPI  Patient is a 42 y.o. G31P3003 female Divorced Caucasian female who presents for her second Gardasil injection. Order to administer given by Nicholaus Bloom, MD on 12/17/2022.   Objective:    There were no vitals taken for this visit.  42 y.o. LMP:  ***  Contraception:  {CCO Contraception:21020264} Given by: {AOB Clinical [id number] Site:  {left/right:311354} deltoid  Lab Review  @THIS  VISIT ONLY@  Assessment:   No diagnosis found.   Plan:   Patient will return in {Gardasil Return Visit:28539} for {FIRST SECOND THIRD:18671} injection.    Tommie Raymond, CMA

## 2023-02-14 NOTE — Patient Instructions (Signed)
HPV (Human Papillomavirus) Vaccine: What You Need to Know Many vaccine information statements are available in Spanish and other languages. See PromoAge.com.br. 1. Why get vaccinated? HPV (human papillomavirus) vaccine can prevent infection with some types of human papillomavirus. HPV infections can cause certain types of cancers, including: cervical, vaginal, and vulvar cancers in women penile cancer in men anal cancers in both men and women cancers of tonsils, base of tongue, and back of throat (oropharyngeal cancer) in both men and women HPV infections can also cause anogenital warts. HPV vaccine can prevent over 90% of cancers caused by HPV. HPV is spread through intimate skin-to-skin or sexual contact. HPV infections are so common that nearly all people will get at least one type of HPV at some time in their lives. Most HPV infections go away on their own within 2 years. But sometimes HPV infections will last longer and can cause cancers later in life. 2. HPV vaccine HPV vaccine is routinely recommended for adolescents at 91 or 44 years of age to ensure they are protected before they are exposed to the virus. HPV vaccine may be given beginning at age 14 years and vaccination is recommended for everyone through 44 years of age. HPV vaccine may be given to adults 27 through 44 years of age, based on discussions between the patient and health care provider. Most children who get the first dose before 45 years of age need 2 doses of HPV vaccine. People who get the first dose at or after 66 years of age and younger people with certain immunocompromising conditions need 3 doses. Your health care provider can give you more information. HPV vaccine may be given at the same time as other vaccines. 3. Talk with your health care provider Tell your vaccination provider if the person getting the vaccine: Has had an allergic reaction after a previous dose of HPV vaccine, or has any severe,  life-threatening allergies Is pregnant--HPV vaccine is not recommended until after pregnancy In some cases, your health care provider may decide to postpone HPV vaccination until a future visit. People with minor illnesses, such as a cold, may be vaccinated. People who are moderately or severely ill should usually wait until they recover before getting HPV vaccine. Your health care provider can give you more information. 4. Risks of a vaccine reaction Soreness, redness, or swelling where the shot is given can happen after HPV vaccination. Fever or headache can happen after HPV vaccination. People sometimes faint after medical procedures, including vaccination. Tell your provider if you feel dizzy or have vision changes or ringing in the ears. As with any medicine, there is a very remote chance of a vaccine causing a severe allergic reaction, other serious injury, or death. 5. What if there is a serious problem? An allergic reaction could occur after the vaccinated person leaves the clinic. If you see signs of a severe allergic reaction (hives, swelling of the face and throat, difficulty breathing, a fast heartbeat, dizziness, or weakness), call 9-1-1 and get the person to the nearest hospital. For other signs that concern you, call your health care provider. Adverse reactions should be reported to the Vaccine Adverse Event Reporting System (VAERS). Your health care provider will usually file this report, or you can do it yourself. Visit the VAERS website at www.vaers.LAgents.no or call 406 681 5856. VAERS is only for reporting reactions, and VAERS staff members do not give medical advice. 6. The National Vaccine Injury Compensation Program The [employer] Vaccine Injury Compensation Program (VICP) is a  federal program that was created to compensate people who may have been injured by certain vaccines. Claims regarding alleged injury or death due to vaccination have a time limit for filing, which may be as  short as two years. Visit the VICP website at SpiritualWord.at or call (234) 853-7186 to learn about the program and about filing a claim. 7. How can I learn more? Ask your health care provider. Call your local or state health department. Visit the website of the Food and Drug Administration (FDA) for vaccine package inserts and additional information at FinderList.no. Contact the Centers for Disease Control and Prevention (CDC): Call 417 046 0203 (1-800-CDC-INFO) or Visit CDC's website at PicCapture.uy. Source: CDC Vaccine Information Statement HPV Vaccine (10/29/2019) This same material is available at FootballExhibition.com.br for no charge. This information is not intended to replace advice given to you by your health care provider. Make sure you discuss any questions you have with your health care provider. Document Revised: 06/26/2022 Document Reviewed: 04/01/2022 Elsevier Patient Education  2024 [employer].

## 2023-04-25 ENCOUNTER — Other Ambulatory Visit: Payer: Self-pay | Admitting: Internal Medicine

## 2023-04-25 DIAGNOSIS — Z1231 Encounter for screening mammogram for malignant neoplasm of breast: Secondary | ICD-10-CM

## 2023-06-04 ENCOUNTER — Ambulatory Visit
Admission: RE | Admit: 2023-06-04 | Discharge: 2023-06-04 | Disposition: A | Discharge status: Home / Self Care | Payer: Self-pay | Source: Ambulatory Visit | Attending: Internal Medicine | Admitting: Internal Medicine

## 2023-06-04 DIAGNOSIS — Z1231 Encounter for screening mammogram for malignant neoplasm of breast: Secondary | ICD-10-CM | POA: Diagnosis present

## 2023-06-16 ENCOUNTER — Ambulatory Visit: Payer: BC Managed Care – PPO

## 2023-06-20 ENCOUNTER — Ambulatory Visit

## 2023-06-20 NOTE — Progress Notes (Deleted)
    NURSE VISIT NOTE  Subjective:    Patient ID: Brenda Snyder, female    DOB: 1980-11-18, 43 y.o.   MRN: [id number]  HPI  Patient is a 43 y.o. G66P3003 female Divorced Caucasian female who presents for her second Gardasil injection. Order to administer given by Nicholaus Bloom, MD on 12/17/22.   Objective:    There were no vitals taken for this visit.  43 y.o. LMP:  ***  Contraception:  {CCO Contraception:21020264} Given by: {AOB Clinical WUJWJ:[id number]} Site:  {left/right:311354} deltoid  Lab Review  No results found for any visits on 06/20/23.    Assessment:   No diagnosis found.   Plan:   Patient will return in {Gardasil Return Visit:28539} for {FIRST SECOND THIRD:18671} injection.    Cornelius Moras, CMA
